# Patient Record
Sex: Female | Born: 2005 | Race: White | Hispanic: No | Marital: Single | Smoking: Never smoker
Health system: Southern US, Community
[De-identification: ages and names within clinical notes are randomized; demographics above are authoritative.]

## PROBLEM LIST (undated history)

## (undated) DIAGNOSIS — J45909 Unspecified asthma, uncomplicated: Secondary | ICD-10-CM

## (undated) HISTORY — PX: ORIF FOREARM FRACTURE: SHX2124

---
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
## 2024-01-24 ENCOUNTER — Ambulatory Visit (HOSPITAL_BASED_OUTPATIENT_CLINIC_OR_DEPARTMENT_OTHER): Admitting: Orthopaedic Surgery

## 2024-01-24 ENCOUNTER — Other Ambulatory Visit (HOSPITAL_BASED_OUTPATIENT_CLINIC_OR_DEPARTMENT_OTHER): Payer: Self-pay

## 2024-01-24 ENCOUNTER — Ambulatory Visit (HOSPITAL_BASED_OUTPATIENT_CLINIC_OR_DEPARTMENT_OTHER): Payer: Self-pay | Admitting: Orthopaedic Surgery

## 2024-01-24 DIAGNOSIS — M6750 Plica syndrome, unspecified knee: Secondary | ICD-10-CM

## 2024-01-24 MED ORDER — OXYCODONE HCL 5 MG PO TABS
5.0000 mg | ORAL_TABLET | ORAL | 0 refills | Status: AC | PRN
  Filled 2024-01-24: qty 5, 1d supply, fill #0

## 2024-01-24 MED ORDER — ACETAMINOPHEN 500 MG PO TABS
500.0000 mg | ORAL_TABLET | Freq: Three times a day (TID) | ORAL | 0 refills | Status: AC
  Filled 2024-01-24 (×2): qty 30, 10d supply, fill #0

## 2024-01-24 MED ORDER — ASPIRIN 325 MG PO TBEC
325.0000 mg | DELAYED_RELEASE_TABLET | Freq: Every day | ORAL | 0 refills | Status: AC
  Filled 2024-01-24 (×2): qty 14, 14d supply, fill #0

## 2024-01-24 MED ORDER — IBUPROFEN 800 MG PO TABS
800.0000 mg | ORAL_TABLET | Freq: Three times a day (TID) | ORAL | 0 refills | Status: AC
  Filled 2024-01-24 (×2): qty 30, 10d supply, fill #0

## 2024-01-24 NOTE — Progress Notes (Signed)
 Chief Complaint: Left knee pain     History of Present Illness:    Misty Duncan is a 18 y.o. female presents today for ongoing left knee pain.  She is experiencing the pain in the medial aspect of the knee.  She states that this occurred since she was playing volleyball several months ago and had an injury while she had to run into the crowd and ran directly into a person while saving a ball.  Since this time she has been doing 2 rounds of physical therapy without relief.  She has been doing her home exercises as well as using an over-the-counter brace.  At this time she is significantly unable to play sports and is modifying all of her activities.  She has occasionally experience giving out the swelling in the knee as well.    PMH/PSH/Family History/Social History/Meds/Allergies:   No past medical history on file. No past surgical history on file. Social History   Socioeconomic History   Marital status: Single    Spouse name: Not on file   Number of children: Not on file   Years of education: Not on file   Highest education level: Not on file  Occupational History   Not on file  Tobacco Use   Smoking status: Never   Smokeless tobacco: Not on file  Substance and Sexual Activity   Alcohol use: Not on file   Drug use: Not on file   Sexual activity: Not on file  Other Topics Concern   Not on file  Social History Narrative   Not on file   Social Drivers of Health   Financial Resource Strain: Not on file  Food Insecurity: Not on file  Transportation Needs: Not on file  Physical Activity: Not on file  Stress: Not on file  Social Connections: Not on file   No family history on file. No Known Allergies Current Outpatient Medications  Medication Sig Dispense Refill   acetaminophen  (TYLENOL ) 500 MG tablet Take 1 tablet (500 mg total) by mouth every 8 (eight) hours for 10 days. 30 tablet 0   aspirin  EC 325 MG tablet Take 1 tablet (325 mg total) by mouth daily. 14 tablet  0   ibuprofen  (ADVIL ) 800 MG tablet Take 1 tablet (800 mg total) by mouth every 8 (eight) hours for 10 days. Please take with food, please alternate with acetaminophen  30 tablet 0   oxyCODONE  (ROXICODONE ) 5 MG immediate release tablet Take 1 tablet (5 mg total) by mouth every 4 (four) hours as needed for severe pain (pain score 7-10) or breakthrough pain. 5 tablet 0   cetirizine (ZYRTEC) 10 MG tablet Take 10 mg by mouth daily as needed for allergies.     ibuprofen  (ADVIL ,MOTRIN ) 200 MG tablet Take 200 mg by mouth every 6 (six) hours as needed for headache.     No current facility-administered medications for this visit.   No results found.  Review of Systems:   A ROS was performed including pertinent positives and negatives as documented in the HPI.  Physical Exam :   Constitutional: NAD and appears stated age Neurological: Alert and oriented Psych: Appropriate affect and cooperative There were no vitals taken for this visit.   Comprehensive Musculoskeletal Exam:    Left knee with tenderness in a C-shaped distribution about the medial knee.  Negative Lachman, negative posterior drawer no varus valgus laxity no joint line tenderness   Imaging:   Xray (4 views left knee): Normal  MRI (left knee):  Hypertrophied plica   I personally reviewed and interpreted the radiographs.   Assessment and Plan:   18 y.o. 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  At this time she has trialed exhaustively physical therapy now 2 rounds without any relief.  Given this I did discuss treatment options including an injection versus arthroscopy and plica excision.  At this time she has been out of sports and unable to play in a higher level and to that effect with discussion of her and her mom and they have elected for left knee arthroscopy with plica excision  -Plan for left knee arthroscopy with plica excision   After a lengthy discussion of treatment options,  including risks, benefits, alternatives, complications of surgical and nonsurgical conservative options, the patient elected surgical repair.   The patient  is aware of the material risks  and complications including, but not limited to injury to adjacent structures, neurovascular injury, infection, numbness, bleeding, implant failure, thermal burns, stiffness, persistent pain, failure to heal, disease transmission from allograft, need for further surgery, dislocation, anesthetic risks, blood clots, risks of death,and others. The probabilities of surgical success and failure discussed with patient given their particular co-morbidities.The time and nature of expected rehabilitation and recovery was discussed.The patient's questions were all answered preoperatively.  No barriers to understanding were noted. I explained the natural history of the disease process and Rx rationale.  I explained to the patient what I considered to be reasonable expectations given their personal situation.  The final treatment plan was arrived at through a shared patient decision making process model.    I personally saw and evaluated the patient, and participated in the management and treatment plan.  Elspeth Parker, MD Attending Physician, Orthopedic Surgery  This document was dictated using Dragon voice recognition software. A reasonable attempt at proof reading has been made to minimize errors.

## 2024-02-29 ENCOUNTER — Telehealth: Payer: Self-pay | Admitting: Orthopaedic Surgery

## 2024-02-29 NOTE — Telephone Encounter (Signed)
 Patient called and said she needs to be reschedule for surgery. CB#[phone]

## 2024-03-01 NOTE — Telephone Encounter (Signed)
 I spoke with the patient 9/18. She has been rescheduled for surgery on 10/28.

## 2024-03-15 ENCOUNTER — Encounter (HOSPITAL_BASED_OUTPATIENT_CLINIC_OR_DEPARTMENT_OTHER): Admitting: Orthopaedic Surgery

## 2024-04-02 ENCOUNTER — Encounter (HOSPITAL_BASED_OUTPATIENT_CLINIC_OR_DEPARTMENT_OTHER): Payer: Self-pay | Admitting: Orthopaedic Surgery

## 2024-04-02 ENCOUNTER — Other Ambulatory Visit: Payer: Self-pay

## 2024-04-08 NOTE — Anesthesia Preprocedure Evaluation (Signed)
 Anesthesia Evaluation  Patient identified by MRN, date of birth, ID band Patient awake    Reviewed: Allergy & Precautions, NPO status , Patient's Chart, lab work & pertinent test results  Airway Mallampati: I  TM Distance: >3 FB Neck ROM: Full    Dental  (+) Teeth Intact, Dental Advisory Given   Pulmonary asthma (albuterol rarely used)    Pulmonary exam normal breath sounds clear to auscultation       Cardiovascular negative cardio ROS Normal cardiovascular exam Rhythm:Regular Rate:Normal     Neuro/Psych negative neurological ROS  negative psych ROS   GI/Hepatic negative GI ROS, Neg liver ROS,,,  Endo/Other  negative endocrine ROS    Renal/GU negative Renal ROS  negative genitourinary   Musculoskeletal negative musculoskeletal ROS (+)    Abdominal   Peds  Hematology negative hematology ROS (+)   Anesthesia Other Findings   Reproductive/Obstetrics negative OB ROS                              Anesthesia Physical Anesthesia Plan  ASA: 2  Anesthesia Plan: General   Post-op Pain Management: Tylenol  PO (pre-op)*   Induction: Intravenous  PONV Risk Score and Plan: 3 and Ondansetron , Dexamethasone, Midazolam, Treatment may vary due to age or medical condition and Scopolamine patch - Pre-op  Airway Management Planned: LMA  Additional Equipment: None  Intra-op Plan:   Post-operative Plan: Extubation in OR  Informed Consent: I have reviewed the patients History and Physical, chart, labs and discussed the procedure including the risks, benefits and alternatives for the proposed anesthesia with the patient or authorized representative who has indicated his/her understanding and acceptance.     Dental advisory given  Plan Discussed with: CRNA  Anesthesia Plan Comments:          Anesthesia Quick Evaluation

## 2024-04-09 ENCOUNTER — Ambulatory Visit (HOSPITAL_BASED_OUTPATIENT_CLINIC_OR_DEPARTMENT_OTHER): Payer: Self-pay | Admitting: Anesthesiology

## 2024-04-09 ENCOUNTER — Other Ambulatory Visit: Payer: Self-pay

## 2024-04-09 ENCOUNTER — Encounter (HOSPITAL_BASED_OUTPATIENT_CLINIC_OR_DEPARTMENT_OTHER)
Admission: RE | Disposition: A | Discharge status: Home / Self Care | Payer: Self-pay | Source: Home / Self Care | Attending: Orthopaedic Surgery

## 2024-04-09 ENCOUNTER — Ambulatory Visit (HOSPITAL_BASED_OUTPATIENT_CLINIC_OR_DEPARTMENT_OTHER)
Admission: RE | Admit: 2024-04-09 | Discharge: 2024-04-09 | Disposition: A | Discharge status: Home / Self Care | Attending: Orthopaedic Surgery | Admitting: Orthopaedic Surgery

## 2024-04-09 ENCOUNTER — Encounter (HOSPITAL_BASED_OUTPATIENT_CLINIC_OR_DEPARTMENT_OTHER): Payer: Self-pay | Admitting: Orthopaedic Surgery

## 2024-04-09 DIAGNOSIS — J45909 Unspecified asthma, uncomplicated: Secondary | ICD-10-CM | POA: Insufficient documentation

## 2024-04-09 DIAGNOSIS — Z01818 Encounter for other preprocedural examination: Secondary | ICD-10-CM

## 2024-04-09 DIAGNOSIS — M6750 Plica syndrome, unspecified knee: Secondary | ICD-10-CM

## 2024-04-09 DIAGNOSIS — M6752 Plica syndrome, left knee: Secondary | ICD-10-CM

## 2024-04-09 HISTORY — PX: KNEE ARTHROSCOPY WITH EXCISION PLICA: SHX5647

## 2024-04-09 HISTORY — DX: Unspecified asthma, uncomplicated: J45.909

## 2024-04-09 LAB — POCT PREGNANCY, URINE: Preg Test, Ur: NEGATIVE

## 2024-04-09 SURGERY — ARTHROSCOPY, KNEE, WITH PLICA EXCISION
Anesthesia: General | Site: Knee | Laterality: Left

## 2024-04-09 MED ORDER — LIDOCAINE 2% (20 MG/ML) 5 ML SYRINGE
INTRAMUSCULAR | Status: AC
  Filled 2024-04-09: qty 5

## 2024-04-09 MED ORDER — HYDROMORPHONE HCL 1 MG/ML IJ SOLN
0.2500 mg | INTRAMUSCULAR | Status: DC | PRN
  Administered 2024-04-09: 0.5 mg via INTRAVENOUS

## 2024-04-09 MED ORDER — SODIUM CHLORIDE 0.9 % IR SOLN
Status: DC | PRN
  Administered 2024-04-09: 3000 mL

## 2024-04-09 MED ORDER — ACETAMINOPHEN 500 MG PO TABS
ORAL_TABLET | ORAL | Status: AC
  Filled 2024-04-09: qty 2

## 2024-04-09 MED ORDER — ONDANSETRON HCL 4 MG/2ML IJ SOLN
INTRAMUSCULAR | Status: AC
  Filled 2024-04-09: qty 2

## 2024-04-09 MED ORDER — GABAPENTIN 300 MG PO CAPS
ORAL_CAPSULE | ORAL | Status: AC
  Filled 2024-04-09: qty 1

## 2024-04-09 MED ORDER — ACETAMINOPHEN 500 MG PO TABS: 1000.0000 mg | ORAL_TABLET | Freq: Once | ORAL | Status: DC

## 2024-04-09 MED ORDER — FENTANYL CITRATE (PF) 100 MCG/2ML IJ SOLN
INTRAMUSCULAR | Status: AC
  Filled 2024-04-09: qty 2

## 2024-04-09 MED ORDER — KETOROLAC TROMETHAMINE 30 MG/ML IJ SOLN: 30.0000 mg | Freq: Once | INTRAMUSCULAR | Status: DC | PRN

## 2024-04-09 MED ORDER — AMISULPRIDE (ANTIEMETIC) 5 MG/2ML IV SOLN: 10.0000 mg | Freq: Once | INTRAVENOUS | Status: DC | PRN

## 2024-04-09 MED ORDER — EPHEDRINE SULFATE-NACL 50-0.9 MG/10ML-% IV SOSY
PREFILLED_SYRINGE | INTRAVENOUS | Status: DC | PRN
  Administered 2024-04-09: 10 mg via INTRAVENOUS

## 2024-04-09 MED ORDER — ONDANSETRON HCL 4 MG/2ML IJ SOLN: 4.0000 mg | Freq: Once | INTRAMUSCULAR | Status: DC | PRN

## 2024-04-09 MED ORDER — BUPIVACAINE HCL (PF) 0.25 % IJ SOLN
INTRAMUSCULAR | Status: DC | PRN
  Administered 2024-04-09: 20 mL

## 2024-04-09 MED ORDER — BUPIVACAINE HCL (PF) 0.25 % IJ SOLN
INTRAMUSCULAR | Status: AC
Start: 2024-04-09 — End: 2024-04-09
  Filled 2024-04-09: qty 60

## 2024-04-09 MED ORDER — KETOROLAC TROMETHAMINE 30 MG/ML IJ SOLN
INTRAMUSCULAR | Status: DC | PRN
  Administered 2024-04-09: 30 mg via INTRAVENOUS

## 2024-04-09 MED ORDER — TRANEXAMIC ACID-NACL 1000-0.7 MG/100ML-% IV SOLN
1000.0000 mg | INTRAVENOUS | Status: AC
  Administered 2024-04-09: 1000 mg via INTRAVENOUS

## 2024-04-09 MED ORDER — GLYCOPYRROLATE PF 0.2 MG/ML IJ SOSY
PREFILLED_SYRINGE | INTRAMUSCULAR | Status: AC
  Filled 2024-04-09: qty 1

## 2024-04-09 MED ORDER — LIDOCAINE 2% (20 MG/ML) 5 ML SYRINGE
INTRAMUSCULAR | Status: DC | PRN
  Administered 2024-04-09: 60 mg via INTRAVENOUS

## 2024-04-09 MED ORDER — PROPOFOL 10 MG/ML IV BOLUS
INTRAVENOUS | Status: DC | PRN
  Administered 2024-04-09: 200 mg via INTRAVENOUS

## 2024-04-09 MED ORDER — OXYCODONE HCL 5 MG PO TABS: 5.0000 mg | ORAL_TABLET | Freq: Once | ORAL | Status: DC | PRN

## 2024-04-09 MED ORDER — LACTATED RINGERS IV SOLN: INTRAVENOUS | Status: DC

## 2024-04-09 MED ORDER — DEXAMETHASONE SOD PHOSPHATE PF 10 MG/ML IJ SOLN
INTRAMUSCULAR | Status: DC | PRN
  Administered 2024-04-09: 10 mg via INTRAVENOUS

## 2024-04-09 MED ORDER — HYDROMORPHONE HCL 1 MG/ML IJ SOLN
INTRAMUSCULAR | Status: AC
  Filled 2024-04-09: qty 0.5

## 2024-04-09 MED ORDER — PHENYLEPHRINE HCL (PRESSORS) 10 MG/ML IV SOLN
INTRAVENOUS | Status: DC | PRN
  Administered 2024-04-09: 160 ug via INTRAVENOUS
  Administered 2024-04-09 (×2): 80 ug via INTRAVENOUS

## 2024-04-09 MED ORDER — FENTANYL CITRATE (PF) 100 MCG/2ML IJ SOLN
INTRAMUSCULAR | Status: DC | PRN
  Administered 2024-04-09: 25 ug via INTRAVENOUS
  Administered 2024-04-09: 50 ug via INTRAVENOUS
  Administered 2024-04-09: 25 ug via INTRAVENOUS

## 2024-04-09 MED ORDER — MIDAZOLAM HCL 2 MG/2ML IJ SOLN
INTRAMUSCULAR | Status: AC
  Filled 2024-04-09: qty 2

## 2024-04-09 MED ORDER — ACETAMINOPHEN 500 MG PO TABS
1000.0000 mg | ORAL_TABLET | Freq: Once | ORAL | Status: AC
  Administered 2024-04-09: 1000 mg via ORAL

## 2024-04-09 MED ORDER — SCOPOLAMINE 1 MG/3DAYS TD PT72
1.0000 | MEDICATED_PATCH | TRANSDERMAL | Status: DC
  Administered 2024-04-09: 1 mg via TRANSDERMAL

## 2024-04-09 MED ORDER — EPHEDRINE 5 MG/ML INJ
INTRAVENOUS | Status: AC
  Filled 2024-04-09: qty 5

## 2024-04-09 MED ORDER — CEFAZOLIN SODIUM-DEXTROSE 2-4 GM/100ML-% IV SOLN
2.0000 g | INTRAVENOUS | Status: AC
  Administered 2024-04-09: 2 g via INTRAVENOUS

## 2024-04-09 MED ORDER — GABAPENTIN 300 MG PO CAPS
300.0000 mg | ORAL_CAPSULE | Freq: Once | ORAL | Status: AC
  Administered 2024-04-09: 300 mg via ORAL

## 2024-04-09 MED ORDER — MIDAZOLAM HCL (PF) 2 MG/2ML IJ SOLN
INTRAMUSCULAR | Status: DC | PRN
  Administered 2024-04-09: 2 mg via INTRAVENOUS

## 2024-04-09 MED ORDER — MEPERIDINE HCL 25 MG/ML IJ SOLN: 6.2500 mg | INTRAMUSCULAR | Status: DC | PRN

## 2024-04-09 MED ORDER — CEFAZOLIN SODIUM-DEXTROSE 2-4 GM/100ML-% IV SOLN
INTRAVENOUS | Status: AC
  Filled 2024-04-09: qty 100

## 2024-04-09 MED ORDER — PHENYLEPHRINE 80 MCG/ML (10ML) SYRINGE FOR IV PUSH (FOR BLOOD PRESSURE SUPPORT)
PREFILLED_SYRINGE | INTRAVENOUS | Status: AC
  Filled 2024-04-09: qty 10

## 2024-04-09 MED ORDER — OXYCODONE HCL 5 MG/5ML PO SOLN: 5.0000 mg | Freq: Once | ORAL | Status: DC | PRN

## 2024-04-09 MED ORDER — ONDANSETRON HCL 4 MG/2ML IJ SOLN
INTRAMUSCULAR | Status: DC | PRN
  Administered 2024-04-09: 4 mg via INTRAVENOUS

## 2024-04-09 MED ORDER — PROPOFOL 500 MG/50ML IV EMUL
INTRAVENOUS | Status: AC
  Filled 2024-04-09: qty 50

## 2024-04-09 MED ORDER — KETOROLAC TROMETHAMINE 30 MG/ML IJ SOLN
INTRAMUSCULAR | Status: AC
Start: 2024-04-09 — End: 2024-04-09
  Filled 2024-04-09: qty 1

## 2024-04-09 MED ORDER — TRANEXAMIC ACID-NACL 1000-0.7 MG/100ML-% IV SOLN
INTRAVENOUS | Status: AC
  Filled 2024-04-09: qty 100

## 2024-04-09 MED ORDER — SCOPOLAMINE 1 MG/3DAYS TD PT72
MEDICATED_PATCH | TRANSDERMAL | Status: AC
  Filled 2024-04-09: qty 1

## 2024-04-09 SURGICAL SUPPLY — 36 items
BLADE EXCALIBUR 4.0X13 (MISCELLANEOUS) IMPLANT
BNDG COMPR ESMARK 6X3 LF (GAUZE/BANDAGES/DRESSINGS) IMPLANT
BNDG ELASTIC 4INX 5YD STR LF (GAUZE/BANDAGES/DRESSINGS) IMPLANT
BNDG ELASTIC 6INX 5YD STR LF (GAUZE/BANDAGES/DRESSINGS) ×1 IMPLANT
CHLORAPREP W/TINT 26 (MISCELLANEOUS) ×1 IMPLANT
COOLER ICEMAN CLASSIC (MISCELLANEOUS) IMPLANT
DERMABOND ADVANCED .7 DNX12 (GAUZE/BANDAGES/DRESSINGS) IMPLANT
DISSECTOR 3.8MM X 13CM (MISCELLANEOUS) ×1 IMPLANT
DRAPE IMP U-DRAPE 54X76 (DRAPES) IMPLANT
DRAPE INCISE IOBAN 66X45 STRL (DRAPES) IMPLANT
DRAPE U-SHAPE 47X51 STRL (DRAPES) IMPLANT
DRAPE-T ARTHROSCOPY W/POUCH (DRAPES) ×1 IMPLANT
DW OUTFLOW CASSETTE/TUBE SET (MISCELLANEOUS) IMPLANT
ELECTRODE REM PT RTRN 9FT ADLT (ELECTROSURGICAL) IMPLANT
EXCALIBUR 3.8MM X 13CM (MISCELLANEOUS) IMPLANT
GAUZE 4X4 16PLY ~~LOC~~+RFID DBL (SPONGE) IMPLANT
GAUZE PAD ABD 8X10 STRL (GAUZE/BANDAGES/DRESSINGS) IMPLANT
GAUZE SPONGE 4X4 12PLY STRL (GAUZE/BANDAGES/DRESSINGS) ×1 IMPLANT
GAUZE XEROFORM 1X8 LF (GAUZE/BANDAGES/DRESSINGS) ×1 IMPLANT
GLOVE BIO SURGEON STRL SZ 6 (GLOVE) ×1 IMPLANT
GLOVE BIO SURGEON STRL SZ7.5 (GLOVE) ×1 IMPLANT
GLOVE BIOGEL PI IND STRL 6.5 (GLOVE) ×1 IMPLANT
GLOVE BIOGEL PI IND STRL 8 (GLOVE) ×1 IMPLANT
GOWN STRL REUS W/ TWL LRG LVL3 (GOWN DISPOSABLE) ×1 IMPLANT
GOWN STRL REUS W/ TWL XL LVL3 (GOWN DISPOSABLE) ×1 IMPLANT
MANIFOLD NEPTUNE II (INSTRUMENTS) ×1 IMPLANT
PACK ARTHROSCOPY DSU (CUSTOM PROCEDURE TRAY) ×1 IMPLANT
PACK BASIN DAY SURGERY FS (CUSTOM PROCEDURE TRAY) ×1 IMPLANT
PAD COLD SHLDR WRAP-ON (PAD) ×1 IMPLANT
PADDING CAST COTTON 6X4 STRL (CAST SUPPLIES) IMPLANT
SLEEVE SCD COMPRESS KNEE MED (STOCKING) ×1 IMPLANT
SUT ETHILON 3 0 PS 1 (SUTURE) ×1 IMPLANT
SUT MNCRL AB 3-0 PS2 27 (SUTURE) IMPLANT
TOWEL GREEN STERILE FF (TOWEL DISPOSABLE) ×2 IMPLANT
TUBING ARTHROSCOPY IRRIG 16FT (MISCELLANEOUS) ×1 IMPLANT
WAND ABLATOR APOLLO I90 (BUR) ×1 IMPLANT

## 2024-04-09 NOTE — Brief Op Note (Signed)
   Brief Op Note  Date of Surgery: 04/09/2024  Preoperative Diagnosis: LEFT KNEE PLICA SYNDROME  Postoperative Diagnosis: same  Procedure: Procedure(s): ARTHROSCOPY, KNEE, WITH PLICA EXCISION  Implants: * No implants in log *  Surgeons: Surgeon(s): Genelle Standing, MD  Anesthesia: General    Estimated Blood Loss: See anesthesia record  Complications: None  Condition to PACU: Stable  Standing LITTIE Genelle, MD 04/09/2024 8:19 AM

## 2024-04-09 NOTE — Transfer of Care (Signed)
 Immediate Anesthesia Transfer of Care Note  Patient: Misty Duncan  Procedure(s) Performed: Procedure(s) (LRB): ARTHROSCOPY, KNEE, WITH PLICA EXCISION (Left)  Patient Location: PACU  Anesthesia Type: General  Level of Consciousness: awake, oriented, sedated and patient cooperative  Airway & Oxygen Therapy: Patient Spontanous Breathing and Patient connected to [place]  Post-op Assessment: Report given to PACU RN and Post -op Vital signs reviewed and stable  Post vital signs: Reviewed and stable  Complications: No apparent anesthesia complications Last Vitals:  Vitals Value Taken Time  BP 102/58 04/09/24 08:15  Temp    Pulse 87 04/09/24 08:18  Resp 14 04/09/24 08:18  SpO2 96 % 04/09/24 08:18  Vitals shown include unfiled device data.  Last Pain:  Vitals:   04/09/24 0650  TempSrc: Temporal  PainSc: 0-No pain         Complications: No notable events documented.

## 2024-04-09 NOTE — Op Note (Signed)
   Date of Surgery: 04/09/2024  INDICATIONS: Misty Duncan is a 18 y.o.-year-old female with left knee symptomatic plica.  The risk and benefits of the procedure were discussed in detail and documented in the pre-operative evaluation.   PREOPERATIVE DIAGNOSIS: 1.  Left knee symptomatic plica  POSTOPERATIVE DIAGNOSIS: Same.  PROCEDURE: 1.  Left knee limited synovectomy  SURGEON: Elspeth LITTIE Parker MD  ASSISTANT: Conley Dawson, ATC  ANESTHESIA:  general  IV FLUIDS AND URINE: See anesthesia record.  ANTIBIOTICS: Ancef  ESTIMATED BLOOD LOSS: 5 mL.  IMPLANTS:  * No implants in log *  DRAINS: None  CULTURES: None  COMPLICATIONS: none  DESCRIPTION OF PROCEDURE:   I identified the patient in the pre-operative holding area.  I marked the operative knee with my initials. I reviewed the risks and benefits of the proposed surgical intervention and the patient wished to proceed.  Anesthesia performed a peripheral nerve block.  Patient was subsequently taken back to the operating room.  The patient was transferred to the operative suite and placed in the supine position with all bony prominences padded.     SCDs were placed on the non-operative lower extremity. Appropriate antibiotics was administered within 1 hour before incision. The operative lower extremity was then prepped and draped in standard fashion. A time out was performed confirming the correct extremity, correct patient and correct procedure.    A standard anterolateral portal was made with an 11 blade.  The ideal position for the anteromedial portal was established using a spinal needle.  This AM portal was then created under direct visualization with an 11 blade.  A full diagnostic arthroscopy was then performed, as described above, including probing of the chondral and meniscal surfaces.     At this point the shaver was introduced and the medial plica was excised first with a shaver and then with electrocautery.  That concluded  the case.  Skin was closed with 2-0 Vicryl and 3-0 nylon. Xeroform gauze, gauze, Tegaderm, Iceman and brace were applied.  Instrument, sponge, and needle counts were correct prior to wound closure and at the conclusion of the case.  The patient was taken to the PACU without complication     POSTOPERATIVE PLAN: She will be weightbearing activity as tolerated.  She will be placed on aspirin  for blood clot prevention.  I will see her back in 2 weeks for suture removal  Elspeth LITTIE Parker, MD 8:19 AM

## 2024-04-09 NOTE — Discharge Instructions (Addendum)
 Discharge Instructions    Attending Surgeon: Elspeth Parker, MD Office Phone Number: [phone]   Diagnosis and Procedures:    Surgeries Performed: Left knee plica removal  Discharge Plan:    Diet: Resume usual diet. Begin with light or bland foods.  Drink plenty of fluids.  Activity:  Weight bearing activity as tolerated. You are advised to go home directly from the hospital or surgical center. Restrict your activities.  GENERAL INSTRUCTIONS: 1.  Please apply ice to your wound to help with swelling and inflammation. This will improve your comfort and your overall recovery following surgery.     2. Please call Dr. Danetta office at [phone] with questions Monday-Friday during business hours. If no one answers, please leave a message and someone should get back to the patient within 24 hours. For emergencies please call 911 or proceed to the emergency room.   3. Patient to notify surgical team if experiences any of the following: Bowel/Bladder dysfunction, uncontrolled pain, nerve/muscle weakness, incision with increased drainage or redness, nausea/vomiting and Fever greater than 101.0 F.  Be alert for signs of infection including redness, streaking, odor, fever or chills. Be alert for excessive pain or bleeding and notify your surgeon immediately.  WOUND INSTRUCTIONS:   Leave your dressing, cast, or splint in place until your post operative visit.  Keep it clean and dry.  Always keep the incision clean and dry until the staples/sutures are removed. If there is no drainage from the incision you should keep it open to air. If there is drainage from the incision you must keep it covered at all times until the drainage stops  Do not soak in a bath tub, hot tub, pool, lake or other body of water until 21 days after your surgery and your incision is completely dry and healed.  If you have removable sutures (or staples) they must be removed 10-14 days (unless otherwise  instructed) from the day of your surgery.     1)  Elevate the extremity as much as possible.  2)  Keep the dressing clean and dry.  3)  Please call us  if the dressing becomes wet or dirty.  4)  If you are experiencing worsening pain or worsening swelling, please call.     MEDICATIONS: Resume all previous home medications at the previous prescribed dose and frequency unless otherwise noted Start taking the  pain medications on an as-needed basis as prescribed  Please taper down pain medication over the next week following surgery.  Ideally you should not require a refill of any narcotic pain medication.  Take pain medication with food to minimize nausea. In addition to the prescribed pain medication, you may take over-the-counter pain relievers such as Tylenol .  Do NOT take additional tylenol  if your pain medication already has tylenol  in it.  Aspirin  325mg  daily per instructions on bottle. Narcotic policy: Per [facility] clinic policy, our goal is ensure optimal postoperative pain control with a multimodal pain management strategy. For all [facility] patients, our goal is to wean post-operative narcotic medications by 6 weeks post-operatively, and many times sooner. If this is not possible due to utilization of pain medication prior to surgery, your [facility] doctor will support your acute post-operative pain control for the first 6 weeks postoperatively, with a plan to transition you back to your primary pain team following that. [facility] will work to ensure a therapist, occupational.       FOLLOWUP INSTRUCTIONS: 1. Follow up at the Physical Therapy Clinic  3-4 days following surgery. This appointment should be scheduled unless other arrangements have been made.The Physical Therapy scheduling number is [phone] if an appointment has not already been arranged.  2. Contact Dr. Danetta office during office hours at [phone] or the practice after hours line at [phone] for non-emergencies.  For medical emergencies call 911.   Discharge Location: Home    Post Anesthesia Home Care Instructions  Activity: Get plenty of rest for the remainder of the day. A responsible individual must stay with you for 24 hours following the procedure.  For the next 24 hours, DO NOT: -Drive a car -Advertising copywriter -Drink alcoholic beverages -Take any medication unless instructed by your physician -Make any legal decisions or sign important papers.  Meals: Start with liquid foods such as gelatin or soup. Progress to regular foods as tolerated. Avoid greasy, spicy, heavy foods. If nausea and/or vomiting occur, drink only clear liquids until the nausea and/or vomiting subsides. Call your physician if vomiting continues.  Special Instructions/Symptoms: Your throat may feel dry or sore from the anesthesia or the breathing tube placed in your throat during surgery. If this causes discomfort, gargle with warm salt water. The discomfort should disappear within 24 hours.  If you had a scopolamine patch placed behind your ear for the management of post- operative nausea and/or vomiting:  1. The medication in the patch is effective for 72 hours, after which it should be removed.  Wrap patch in a tissue and discard in the trash. Wash hands thoroughly with soap and water. 2. You may remove the patch earlier than 72 hours if you experience unpleasant side effects which may include dry mouth, dizziness or visual disturbances. 3. Avoid touching the patch. Wash your hands with soap and water after contact with the patch.     Next dose of tylenol  may be taken at 1p

## 2024-04-09 NOTE — H&P (Signed)
 Expand All Collapse All       Chief Complaint: Left knee pain        History of Present Illness:      Misty Duncan is a 18 y.o. female presents today for ongoing left knee pain.  She is experiencing the pain in the medial aspect of the knee.  She states that this occurred since she was playing volleyball several months ago and had an injury while she had to run into the crowd and ran directly into a person while saving a ball.  Since this time she has been doing 2 rounds of physical therapy without relief.  She has been doing her home exercises as well as using an over-the-counter brace.  At this time she is significantly unable to play sports and is modifying all of her activities.  She has occasionally experience giving out the swelling in the knee as well.       PMH/PSH/Family History/Social History/Meds/Allergies:   No past medical history on file.     No past surgical history on file.     Social History         Socioeconomic History   Marital status: Single      Spouse name: Not on file   Number of children: Not on file   Years of education: Not on file   Highest education level: Not on file  Occupational History   Not on file  Tobacco Use   Smoking status: Never   Smokeless tobacco: Not on file  Substance and Sexual Activity   Alcohol use: Not on file   Drug use: Not on file   Sexual activity: Not on file  Other Topics Concern   Not on file  Social History Narrative   Not on file    Social Drivers of Health    Financial Resource Strain: Not on file  Food Insecurity: Not on file  Transportation Needs: Not on file  Physical Activity: Not on file  Stress: Not on file  Social Connections: Not on file    No family history on file.     Allergies  No Known Allergies         Current Outpatient Medications  Medication Sig Dispense Refill   acetaminophen  (TYLENOL ) 500 MG tablet Take 1 tablet (500 mg total) by mouth every 8 (eight) hours for 10 days. 30  tablet 0   aspirin  EC 325 MG tablet Take 1 tablet (325 mg total) by mouth daily. 14 tablet 0   ibuprofen  (ADVIL ) 800 MG tablet Take 1 tablet (800 mg total) by mouth every 8 (eight) hours for 10 days. Please take with food, please alternate with acetaminophen  30 tablet 0   oxyCODONE  (ROXICODONE ) 5 MG immediate release tablet Take 1 tablet (5 mg total) by mouth every 4 (four) hours as needed for severe pain (pain score 7-10) or breakthrough pain. 5 tablet 0   cetirizine (ZYRTEC) 10 MG tablet Take 10 mg by mouth daily as needed for allergies.       ibuprofen  (ADVIL ,MOTRIN ) 200 MG tablet Take 200 mg by mouth every 6 (six) hours as needed for headache.          No current facility-administered medications for this visit.      Imaging Results (Last 48 hours)  No results found.     Review of Systems:   A ROS was performed including pertinent positives and negatives as documented in the HPI.   Physical Exam :   Constitutional: NAD and  appears stated age Neurological: Alert and oriented Psych: Appropriate affect and cooperative There were no vitals taken for this visit.    Comprehensive Musculoskeletal Exam:     Left knee with tenderness in a C-shaped distribution about the medial knee.  Negative Lachman, negative posterior drawer no varus valgus laxity no joint line tenderness     Imaging:   Xray (4 views left knee): Normal   MRI (left knee): Hypertrophied plica     I personally reviewed and interpreted the radiographs.     Assessment and Plan:   18 y.o. 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  At this time she has trialed exhaustively physical therapy now 2 rounds without any relief.  Given this I did discuss treatment options including an injection versus arthroscopy and plica excision.  At this time she has been out of sports and unable to play in a higher level and to that effect with discussion of her and her mom and they have elected for left  knee arthroscopy with plica excision   -Plan for left knee arthroscopy with plica excision     After a lengthy discussion of treatment options, including risks, benefits, alternatives, complications of surgical and nonsurgical conservative options, the patient elected surgical repair.    The patient  is aware of the material risks  and complications including, but not limited to injury to adjacent structures, neurovascular injury, infection, numbness, bleeding, implant failure, thermal burns, stiffness, persistent pain, failure to heal, disease transmission from allograft, need for further surgery, dislocation, anesthetic risks, blood clots, risks of death,and others. The probabilities of surgical success and failure discussed with patient given their particular co-morbidities.The time and nature of expected rehabilitation and recovery was discussed.The patient's questions were all answered preoperatively.  No barriers to understanding were noted. I explained the natural history of the disease process and Rx rationale.  I explained to the patient what I considered to be reasonable expectations given their personal situation.  The final treatment plan was arrived at through a shared patient decision making process model.       I personally saw and evaluated the patient, and participated in the management and treatment plan.   Elspeth Parker, MD Attending Physician, Orthopedic Surgery   This document was dictated using Dragon voice recognition software. A reasonable attempt at proof reading has been made to minimize errors.

## 2024-04-09 NOTE — Anesthesia Postprocedure Evaluation (Signed)
 Anesthesia Post Note  Patient: Misty Duncan  Procedure(s) Performed: ARTHROSCOPY, KNEE, WITH PLICA EXCISION (Left: Knee)     Patient location during evaluation: PACU Anesthesia Type: General Level of consciousness: awake and alert, oriented and patient cooperative Pain management: pain level controlled Vital Signs Assessment: post-procedure vital signs reviewed and stable Respiratory status: spontaneous breathing, nonlabored ventilation and respiratory function stable Cardiovascular status: blood pressure returned to baseline and stable Postop Assessment: no apparent nausea or vomiting Anesthetic complications: no   No notable events documented.  Last Vitals:  Vitals:   04/09/24 0845 04/09/24 0900  BP: (!) 95/58 (!) 86/53  Pulse: 75 62  Resp: 13 12  Temp:    SpO2: 98% 98%    Last Pain:  Vitals:   04/09/24 0900  TempSrc:   PainSc: 3                  [name]

## 2024-04-09 NOTE — Progress Notes (Signed)
 SCDs on in OR

## 2024-04-09 NOTE — Anesthesia Procedure Notes (Addendum)
 Procedure Name: LMA Insertion Date/Time: 04/09/2024 7:31 AM  Performed by: Delayne Olam BIRCH, CRNAPre-anesthesia Checklist: Patient identified, Emergency Drugs available, Suction available and Patient being monitored Patient Re-evaluated:Patient Re-evaluated prior to induction Oxygen Delivery Method: Circle system utilized Preoxygenation: Pre-oxygenation with 100% oxygen Induction Type: IV induction Ventilation: Mask ventilation without difficulty LMA: LMA inserted LMA Size: 4.0 Number of attempts: 1 Airway Equipment and Method: Bite block Placement Confirmation: positive ETCO2 Tube secured with: Tape Dental Injury: Teeth and Oropharynx as per pre-operative assessment

## 2024-04-10 ENCOUNTER — Encounter (HOSPITAL_BASED_OUTPATIENT_CLINIC_OR_DEPARTMENT_OTHER): Payer: Self-pay | Admitting: Orthopaedic Surgery

## 2024-04-24 ENCOUNTER — Ambulatory Visit (INDEPENDENT_AMBULATORY_CARE_PROVIDER_SITE_OTHER): Admitting: Orthopaedic Surgery

## 2024-04-24 DIAGNOSIS — M6750 Plica syndrome, unspecified knee: Secondary | ICD-10-CM

## 2024-04-24 NOTE — Progress Notes (Signed)
 Post Operative Evaluation    Procedure/Date of Surgery: Left knee plica excision 10/28  Interval History:    Presents today 2 weeks status post above procedure.  Overall she is doing well.  Just some with minor swelling but overall continuing to improve   PMH/PSH/Family History/Social History/Meds/Allergies:    Past Medical History:  Diagnosis Date   Asthma    Past Surgical History:  Procedure Laterality Date   KNEE ARTHROSCOPY WITH EXCISION PLICA Left 04/09/2024   Procedure: ARTHROSCOPY, KNEE, WITH PLICA EXCISION;  Surgeon: Genelle Standing, MD;  Location: [facility];  Service: Orthopedics;  Laterality: Left;   ORIF FOREARM FRACTURE     Social History   Socioeconomic History   Marital status: Single    Spouse name: Not on file   Number of children: Not on file   Years of education: Not on file   Highest education level: Not on file  Occupational History   Not on file  Tobacco Use   Smoking status: Never   Smokeless tobacco: Not on file  Vaping Use   Vaping status: Never Used  Substance and Sexual Activity   Alcohol use: Never   Drug use: Never   Sexual activity: Not on file  Other Topics Concern   Not on file  Social History Narrative   Not on file   Social Drivers of Health   Financial Resource Strain: Not on file  Food Insecurity: Not on file  Transportation Needs: Not on file  Physical Activity: Not on file  Stress: Not on file  Social Connections: Not on file   No family history on file. No Known Allergies Current Outpatient Medications  Medication Sig Dispense Refill   albuterol (VENTOLIN HFA) 108 (90 Base) MCG/ACT inhaler Inhale into the lungs every 6 (six) hours as needed for wheezing or shortness of breath.     aspirin  EC 325 MG tablet Take 1 tablet (325 mg total) by mouth daily. 14 tablet 0   cetirizine (ZYRTEC) 10 MG tablet Take 10 mg by mouth daily as needed for allergies.     ibuprofen   (ADVIL ,MOTRIN ) 200 MG tablet Take 200 mg by mouth every 6 (six) hours as needed for headache.     oxyCODONE  (ROXICODONE ) 5 MG immediate release tablet Take 1 tablet (5 mg total) by mouth every 4 (four) hours as needed for severe pain (pain score 7-10) or breakthrough pain. 5 tablet 0   No current facility-administered medications for this visit.   No results found.  Review of Systems:   A ROS was performed including pertinent positives and negatives as documented in the HPI.   Musculoskeletal Exam:    Left knee pain with incisions which are well-appearing, range of motion is from 0 to 100 degrees distal neurosensory exams intact  Imaging:      I personally reviewed and interpreted the radiographs.   Assessment:   18 year old female status post left knee arthroscopic plica excision overall doing well.  At this time she will continue to progress to weightbearing and range of motion I will see her back in 4 weeks for reassessment  Plan :    - Return to clinic 4 weeks for reassessment      I personally saw and evaluated the patient, and participated in the management and treatment plan.  Standing  Genelle, MD Attending Physician, Orthopedic Surgery  This document was dictated using Dragon voice recognition software. A reasonable attempt at proof reading has been made to minimize errors.

## 2024-05-24 ENCOUNTER — Encounter (HOSPITAL_BASED_OUTPATIENT_CLINIC_OR_DEPARTMENT_OTHER): Admitting: Orthopaedic Surgery
